# Patient Record
Sex: Female | Born: 1975 | Race: White | Hispanic: No | Smoking: Never smoker
Health system: Southern US, Community
[De-identification: ages and names within clinical notes are randomized; demographics above are authoritative.]

## PROBLEM LIST (undated history)

## (undated) DIAGNOSIS — I10 Essential (primary) hypertension: Secondary | ICD-10-CM

## (undated) DIAGNOSIS — K529 Noninfective gastroenteritis and colitis, unspecified: Secondary | ICD-10-CM

## (undated) HISTORY — PX: THYROID SURGERY: SHX805

## (undated) HISTORY — PX: ABDOMINAL HYSTERECTOMY: SHX81

## (undated) HISTORY — PX: COLON RESECTION: SHX5231

## (undated) HISTORY — PX: KNEE SURGERY: SHX244

## (undated) HISTORY — PX: CHOLECYSTECTOMY: SHX55

---

## 2015-11-06 ENCOUNTER — Encounter (HOSPITAL_BASED_OUTPATIENT_CLINIC_OR_DEPARTMENT_OTHER): Payer: Self-pay

## 2015-11-06 ENCOUNTER — Emergency Department (HOSPITAL_BASED_OUTPATIENT_CLINIC_OR_DEPARTMENT_OTHER): Payer: Medicaid Other

## 2015-11-06 ENCOUNTER — Emergency Department (HOSPITAL_BASED_OUTPATIENT_CLINIC_OR_DEPARTMENT_OTHER)
Admission: EM | Admit: 2015-11-06 | Discharge: 2015-11-06 | Disposition: A | Discharge status: Home / Self Care | Payer: Medicaid Other | Attending: Emergency Medicine | Admitting: Emergency Medicine

## 2015-11-06 DIAGNOSIS — I10 Essential (primary) hypertension: Secondary | ICD-10-CM | POA: Insufficient documentation

## 2015-11-06 DIAGNOSIS — Z79899 Other long term (current) drug therapy: Secondary | ICD-10-CM | POA: Insufficient documentation

## 2015-11-06 DIAGNOSIS — R1031 Right lower quadrant pain: Secondary | ICD-10-CM | POA: Diagnosis present

## 2015-11-06 HISTORY — DX: Essential (primary) hypertension: I10

## 2015-11-06 HISTORY — DX: Noninfective gastroenteritis and colitis, unspecified: K52.9

## 2015-11-06 LAB — COMPREHENSIVE METABOLIC PANEL
ALK PHOS: 84 U/L (ref 38–126)
ALT: 13 U/L — ABNORMAL LOW (ref 14–54)
ANION GAP: 8 (ref 5–15)
AST: 16 U/L (ref 15–41)
Albumin: 4.2 g/dL (ref 3.5–5.0)
BILIRUBIN TOTAL: 0.8 mg/dL (ref 0.3–1.2)
BUN: 9 mg/dL (ref 6–20)
CALCIUM: 9.2 mg/dL (ref 8.9–10.3)
CO2: 25 mmol/L (ref 22–32)
Chloride: 101 mmol/L (ref 101–111)
Creatinine, Ser: 0.74 mg/dL (ref 0.44–1.00)
GFR calc non Af Amer: >60 mL/min (ref >60)
Glucose, Bld: 108 mg/dL — ABNORMAL HIGH (ref 65–99)
Potassium: 3.1 mmol/L — ABNORMAL LOW (ref 3.5–5.1)
SODIUM: 134 mmol/L — AB (ref 135–145)
TOTAL PROTEIN: 7.9 g/dL (ref 6.5–8.1)

## 2015-11-06 LAB — CBC WITH DIFFERENTIAL/PLATELET
Basophils Absolute: 0 10*3/uL (ref 0.0–0.1)
Basophils Relative: 0 %
EOS ABS: 0 10*3/uL (ref 0.0–0.7)
Eosinophils Relative: 0 %
HEMATOCRIT: 41.4 % (ref 36.0–46.0)
HEMOGLOBIN: 14.6 g/dL (ref 12.0–15.0)
LYMPHS ABS: 1.6 10*3/uL (ref 0.7–4.0)
Lymphocytes Relative: 17 %
MCH: 31.1 pg (ref 26.0–34.0)
MCHC: 35.3 g/dL (ref 30.0–36.0)
MCV: 88.3 fL (ref 78.0–100.0)
MONOS PCT: 7 %
Monocytes Absolute: 0.6 10*3/uL (ref 0.1–1.0)
NEUTROS PCT: 76 %
Neutro Abs: 7 10*3/uL (ref 1.7–7.7)
Platelets: 279 10*3/uL (ref 150–400)
RBC: 4.69 MIL/uL (ref 3.87–5.11)
RDW: 13.1 % (ref 11.5–15.5)
WBC: 9.2 10*3/uL (ref 4.0–10.5)

## 2015-11-06 LAB — URINALYSIS, ROUTINE W REFLEX MICROSCOPIC
Bilirubin Urine: NEGATIVE
Glucose, UA: NEGATIVE mg/dL
Ketones, ur: NEGATIVE mg/dL
Leukocytes, UA: NEGATIVE
Nitrite: NEGATIVE
Protein, ur: NEGATIVE mg/dL
Specific Gravity, Urine: 1.014 (ref 1.005–1.030)
pH: 7 (ref 5.0–8.0)

## 2015-11-06 LAB — URINE MICROSCOPIC-ADD ON

## 2015-11-06 MED ORDER — IOPAMIDOL (ISOVUE-300) INJECTION 61%
100.0000 mL | Freq: Once | INTRAVENOUS | Status: AC | PRN
  Administered 2015-11-06: 100 mL via INTRAVENOUS

## 2015-11-06 MED ORDER — ONDANSETRON HCL 4 MG/2ML IJ SOLN
4.0000 mg | Freq: Once | INTRAMUSCULAR | Status: AC
  Administered 2015-11-06: 4 mg via INTRAVENOUS
  Filled 2015-11-06: qty 2

## 2015-11-06 MED ORDER — FENTANYL CITRATE (PF) 100 MCG/2ML IJ SOLN
50.0000 ug | Freq: Once | INTRAMUSCULAR | Status: AC
  Administered 2015-11-06: 50 ug via INTRAVENOUS
  Filled 2015-11-06: qty 2

## 2015-11-06 MED ORDER — SODIUM CHLORIDE 0.9 % IV BOLUS (SEPSIS)
1000.0000 mL | Freq: Once | INTRAVENOUS | Status: AC
  Administered 2015-11-06: 1000 mL via INTRAVENOUS

## 2015-11-06 NOTE — ED Provider Notes (Signed)
[facility] Provider Note   CSN: [id number] Arrival date & time: 11/06/15  1114     History   Chief Complaint Chief Complaint  Patient presents with  . Abdominal Pain    HPI Heidi Patel is a 40 y.o. female.  40 year old female who reports a history of abdominal pain associated with multiple different diagnoses including ulcerative colitis as well as diverticulitis which required a partial colectomy according to her report. This was all done in [place], she has not had any records in our system. She reports that over the last several days she has developed recurrent right lower quadrant pain which is sharp and stabbing, focal, intermittent, severe at worst, mild at best. She has had associated diarrhea with blood streaks, nausea but no vomiting and no fevers or chills.      Past Medical History:  Diagnosis Date  . Colitis   . Hypertension     There are no active problems to display for this patient.   Past Surgical History:  Procedure Laterality Date  . ABDOMINAL HYSTERECTOMY    . CHOLECYSTECTOMY    . COLON RESECTION    . KNEE SURGERY    . THYROID SURGERY      OB History    No data available       Home Medications    Prior to Admission medications   Medication Sig Start Date End Date Taking? Authorizing Provider  dicyclomine (BENTYL) 20 MG tablet Take 40 mg by mouth 4 (four) times daily -  before meals and at bedtime.   Yes Historical Provider, MD  lisinopril-hydrochlorothiazide (PRINZIDE,ZESTORETIC) 20-25 MG tablet Take 1 tablet by mouth daily.   Yes Historical Provider, MD    Family History No family history on file.  Social History Social History  Substance Use Topics  . Smoking status: Never Smoker  . Smokeless tobacco: Never Used  . Alcohol use No     Allergies   Review of patient's allergies indicates no known allergies.   Review of Systems Review of Systems  All other systems reviewed and are  negative.    Physical Exam Updated Vital Signs BP (!) 140/105 (BP Location: Right Arm)   Pulse 92   Temp 97.9 F (36.6 C) (Oral)   Resp 20   Ht 5\' 3"  (1.6 m)   Wt 229 lb (103.9 kg)   SpO2 100%   BMI 40.57 kg/m   Physical Exam  Constitutional: She appears well-developed and well-nourished. No distress.  HENT:  Head: Normocephalic and atraumatic.  Mouth/Throat: Oropharynx is clear and moist. No oropharyngeal exudate.  Eyes: Conjunctivae and EOM are normal. Pupils are equal, round, and reactive to light. Right eye exhibits no discharge. Left eye exhibits no discharge. No scleral icterus.  Neck: Normal range of motion. Neck supple. No JVD present. No thyromegaly present.  Cardiovascular: Regular rhythm, normal heart sounds and intact distal pulses.  Exam reveals no gallop and no friction rub.   No murmur heard. Mild tachy  Pulmonary/Chest: Effort normal and breath sounds normal. No respiratory distress. She has no wheezes. She has no rales.  Abdominal: Soft. Bowel sounds are normal. She exhibits no distension and no mass. There is no tenderness ( RLQ ttp, no guarding, masses or peritoneal signs).  Musculoskeletal: Normal range of motion. She exhibits no edema or tenderness.  Lymphadenopathy:    She has no cervical adenopathy.  Neurological: She is alert. Coordination normal.  Skin: Skin is warm and dry. No rash noted. No erythema.  Psychiatric: She has a normal mood and affect. Her behavior is normal.  Nursing note and vitals reviewed.    ED Treatments / Results  Labs (all labs ordered are listed, but only abnormal results are displayed) Labs Reviewed  URINALYSIS, ROUTINE W REFLEX MICROSCOPIC (NOT AT [facility]) - Abnormal; Notable for the following:       Result Value   APPearance CLOUDY (*)    Hgb urine dipstick SMALL (*)    All other components within normal limits  URINE MICROSCOPIC-ADD ON - Abnormal; Notable for the following:    Squamous Epithelial / LPF 0-5 (*)     Bacteria, UA MANY (*)    All other components within normal limits  COMPREHENSIVE METABOLIC PANEL - Abnormal; Notable for the following:    Sodium 134 (*)    Potassium 3.1 (*)    Glucose, Bld 108 (*)    ALT 13 (*)    All other components within normal limits  CBC WITH DIFFERENTIAL/PLATELET     Radiology Ct Abdomen Pelvis W Contrast  Result Date: 11/06/2015 CLINICAL DATA:  RIGHT-side abdominal pain with blood in stool for 1 day, past history of diverticulitis with colon surgery and abscess last year EXAM: CT ABDOMEN AND PELVIS WITH CONTRAST TECHNIQUE: Multidetector CT imaging of the abdomen and pelvis was performed using the standard protocol following bolus administration of intravenous contrast. Sagittal and coronal MPR images reconstructed from axial data set. CONTRAST:  ISOVUE-300 IOPAMIDOL (ISOVUE-300) INJECTION 61% IV. Dilute oral contrast. COMPARISON:  None FINDINGS: Lower chest:  Lung bases clear Hepatobiliary: Gallbladder and liver normal appearance Pancreas: Normal appearance Spleen: Normal appearance.  Probable tiny splenules. Adrenals/Urinary Tract: Adrenal glands normal appearance. Kidneys, ureters, and bladder normal appearance. Stomach/Bowel: Normal appendix. Colon incompletely distended limiting assessment of wall thickness. Few scattered colonic diverticula. Prior sigmoid resection with anastomosis. Stomach and small bowel loops unremarkable. Vascular/Lymphatic: Aorta normal caliber.  No adenopathy. Reproductive: Uterus surgically absent. Probable residual cervix. Normal ovaries. Other: No free air or free fluid. Postsurgical scar at the anterior abdominal wall of the pelvis. No hernia. Musculoskeletal: Degenerative disc disease changes at L5-S1 with BILATERAL neural foraminal narrowing of L5-S1. IMPRESSION: No acute intra-abdominal or intrapelvic abnormalities. Minimal scattered colonic diverticulosis and evidence of prior sigmoid resection without evidence of acute  diverticulitis. Electronically Signed   By: Ulyses Southward M.D.   On: 11/06/2015 14:08    Procedures Procedures (including critical care time)  Medications Ordered in ED Medications  sodium chloride 0.9 % bolus 1,000 mL (0 mLs Intravenous Stopped 11/06/15 1310)  fentaNYL (SUBLIMAZE) injection 50 mcg (50 mcg Intravenous Given 11/06/15 1233)  ondansetron (ZOFRAN) injection 4 mg (4 mg Intravenous Given 11/06/15 1228)  iopamidol (ISOVUE-300) 61 % injection 100 mL (100 mLs Intravenous Contrast Given 11/06/15 1324)     Initial Impression / Assessment and Plan / ED Course  I have reviewed the triage vital signs and the nursing notes.  Pertinent labs & imaging results that were available during my care of the patient were reviewed by me and considered in my medical decision making (see chart for details).  Clinical Course   I would consider the patient to have a flareup of ulcerative colitis, this could be related to diverticulitis, perforation or recurrent abscess. Unsure of her status regarding appendicitis given prior partial colectomy. CT scan ordered, fluids, pain medications.  Reviewed labs and CT scan as well as the drug database, the patient has had multiple prescriptions filled for narcotic medications since March, this includes 23  different prescriptions. The patient will not be given any prescription medications at this visit. Her CT scan does not show any surgical findings. Vital signs reviewed, patient informed of the results  Final Clinical Impressions(s) / ED Diagnoses   Final diagnoses:  Right lower quadrant abdominal pain    New Prescriptions New Prescriptions   No medications on file     Eber HongBrian Margareta Laureano, MD 11/06/15 1414

## 2015-11-06 NOTE — Discharge Instructions (Signed)

## 2015-11-06 NOTE — ED Triage Notes (Signed)
C/o abd pain n/d started yesterday-c/o bloody stools since [place]

## 2017-10-15 IMAGING — CT CT ABD-PELV W/ CM
2 of 4 series · 16 of 46 positions shown, 18 images · IV contrast (APPLIED)
Comparison: None

CLINICAL DATA: RIGHT-side abdominal pain with blood in stool for 1
day, past history of diverticulitis with colon surgery and abscess
last year

EXAM:
CT ABDOMEN AND PELVIS WITH CONTRAST
TECHNIQUE: Multidetector CT imaging of the abdomen and pelvis was performed
using the standard protocol following bolus administration of
intravenous contrast. Sagittal and coronal MPR images reconstructed
from axial data set.
CONTRAST:  100mL V3V4CT-SCC IOPAMIDOL (V3V4CT-SCC) INJECTION 61% IV.
Dilute oral contrast.

[Series 2: axial st · axial · 0.98mm/px · z∈[-473,+7]mm · 13 of 104 slices shown, 15 images]
[im 4/104  soft-tissue]
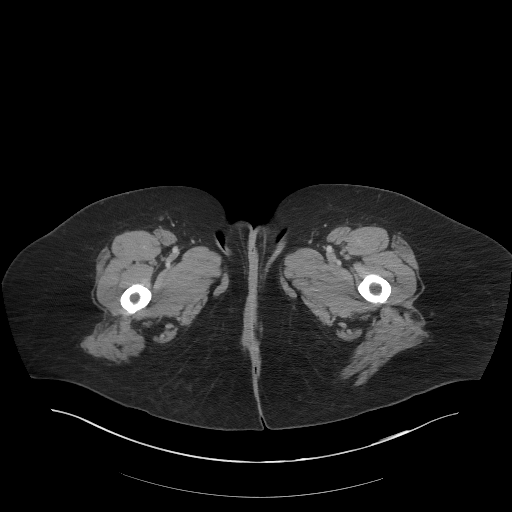
[im 4/104  bone]
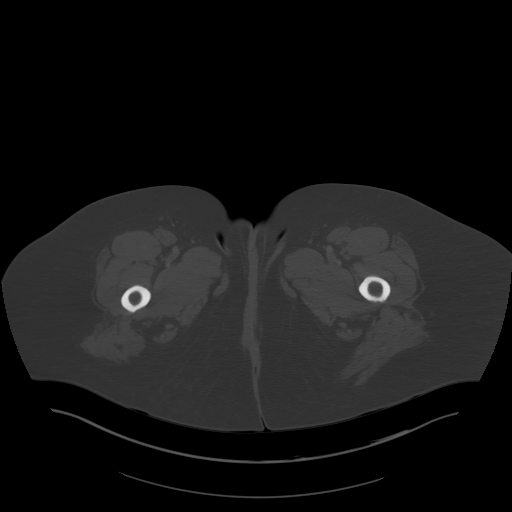
[im 12/104  soft-tissue]
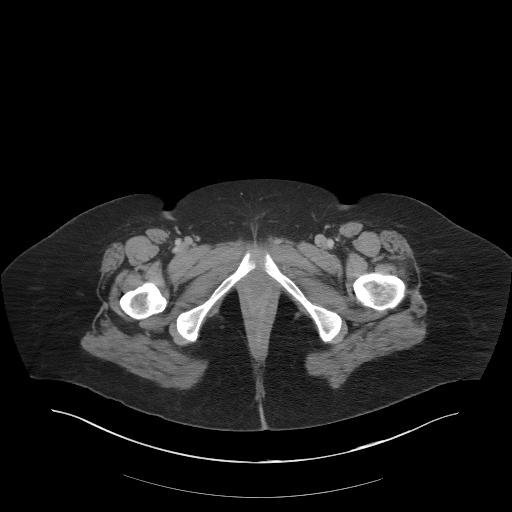
[im 20/104  soft-tissue]
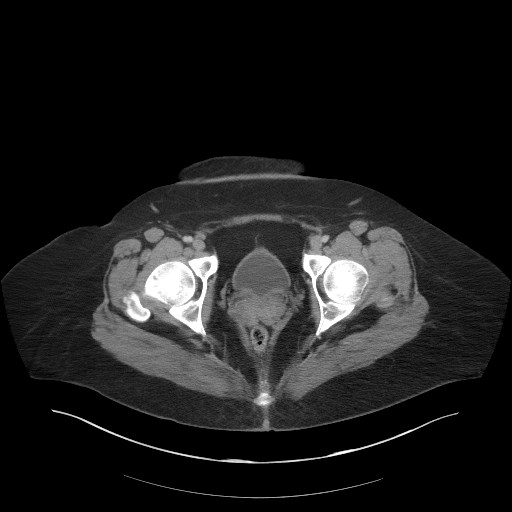
[im 28/104  soft-tissue]
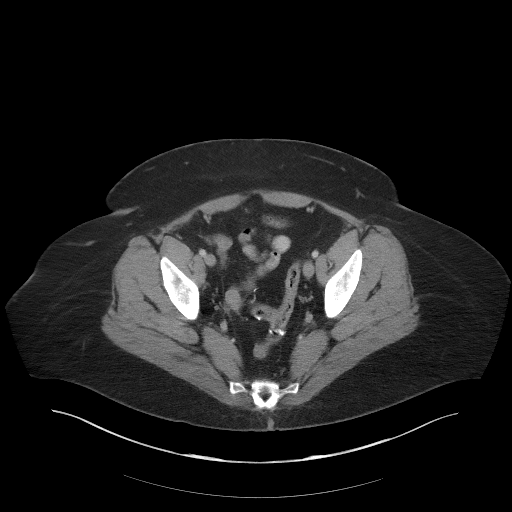
[im 36/104  soft-tissue]
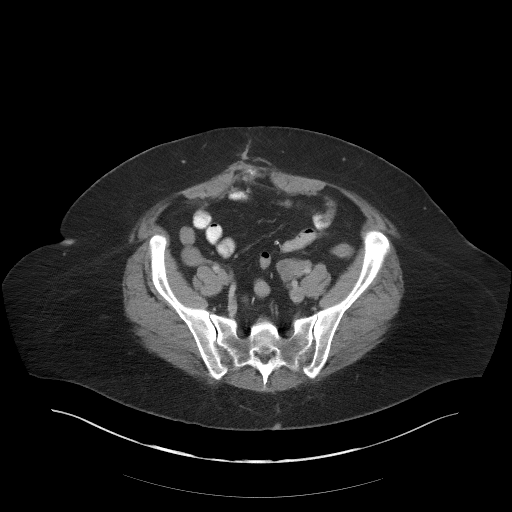
[im 44/104  soft-tissue]
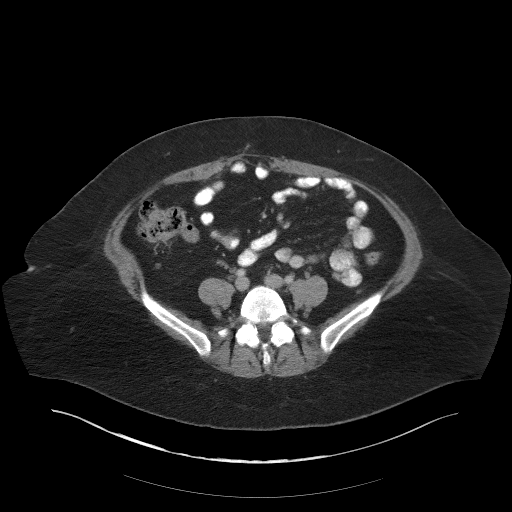
[im 52/104  soft-tissue]
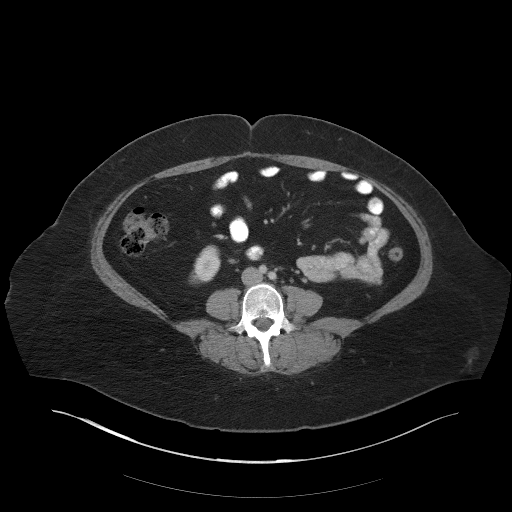
[im 60/104  soft-tissue]
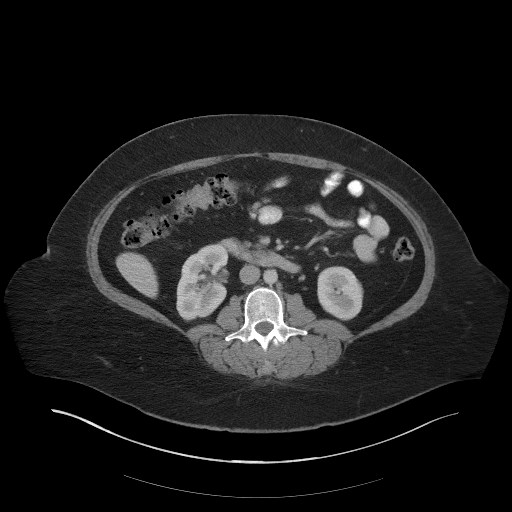
[im 68/104  soft-tissue]
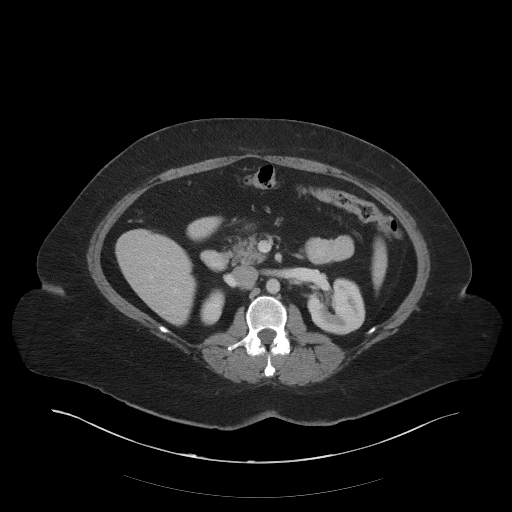
[im 68/104  bone]
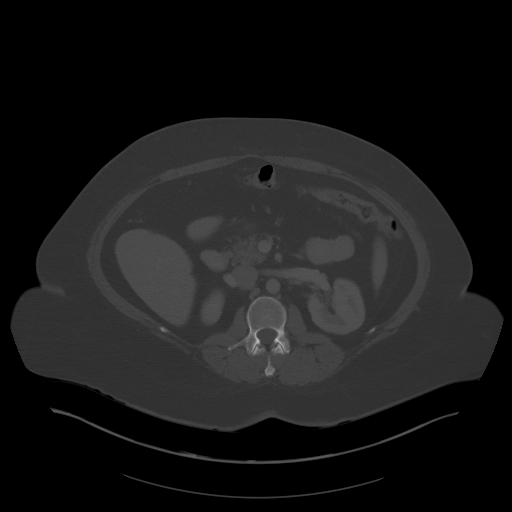
[im 76/104  soft-tissue]
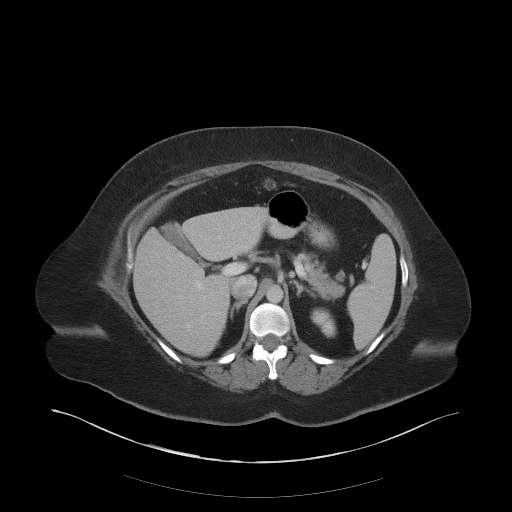
[im 84/104  soft-tissue]
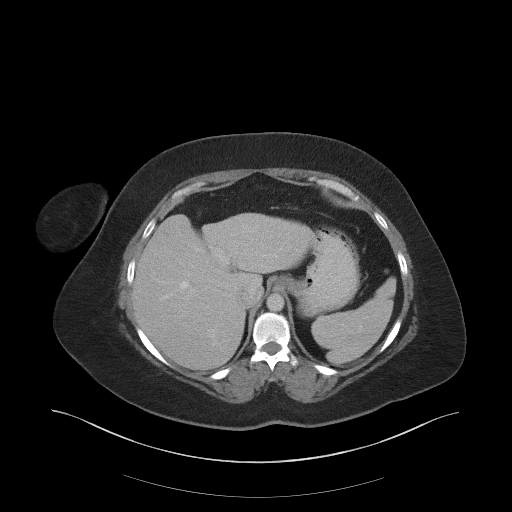
[im 92/104  soft-tissue]
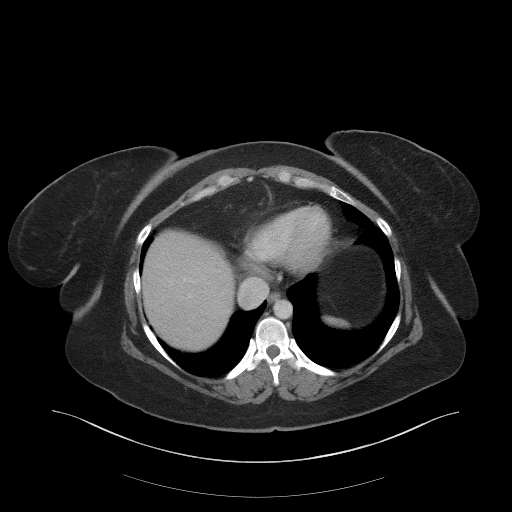
[im 100/104  soft-tissue]
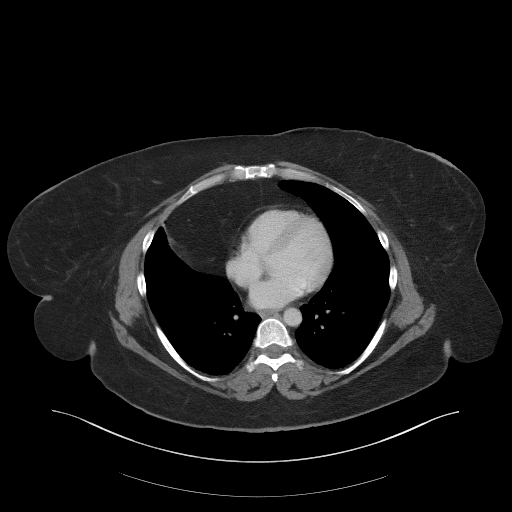

[Series 5: coronal st · coronal · 1.06mm/px · 3 of 91 slices shown]
[im 31/91  soft-tissue]
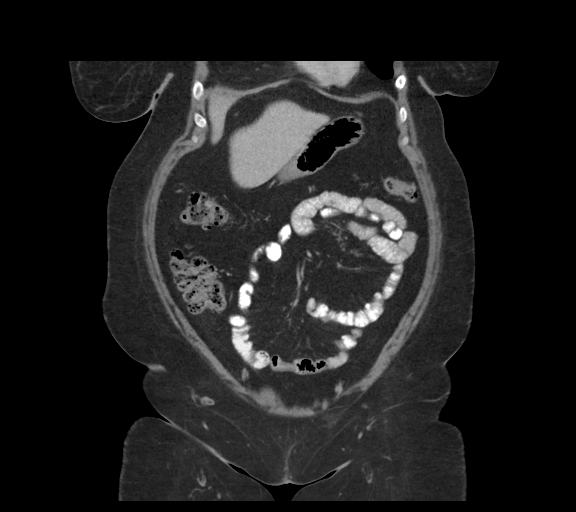
[im 41/91  soft-tissue]
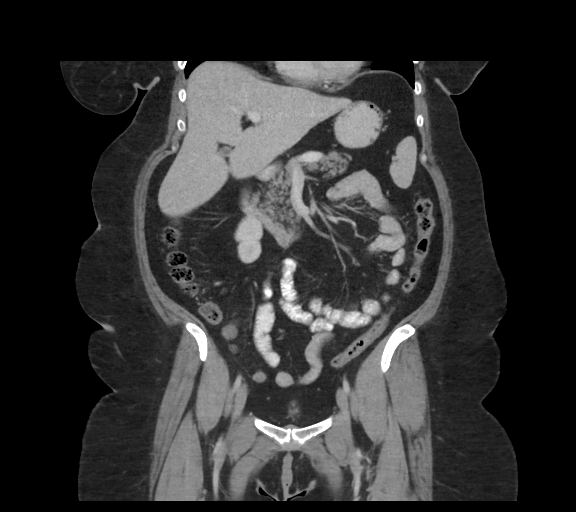
[im 51/91  soft-tissue]
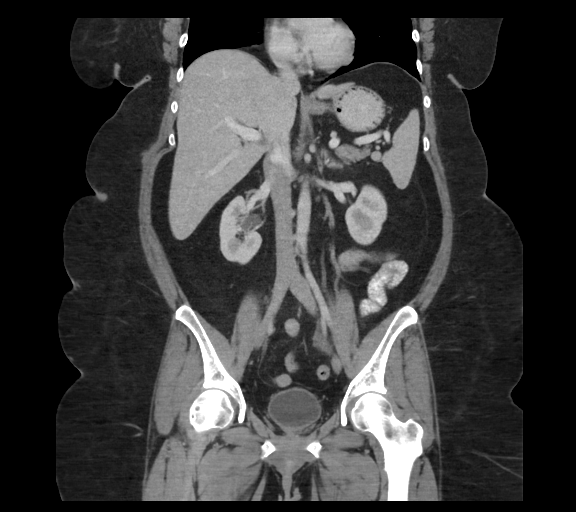

[16 of 46 positions shown; findings below may reference images not displayed]

FINDINGS: Lower chest:  Lung bases clear

Hepatobiliary: Gallbladder and liver normal appearance

Pancreas: Normal appearance

Spleen: Normal appearance.  Probable tiny splenules.

Adrenals/Urinary Tract: Adrenal glands normal appearance. Kidneys,
ureters, and bladder normal appearance.

Stomach/Bowel: Normal appendix. Colon incompletely distended
limiting assessment of wall thickness. Few scattered colonic
diverticula. Prior sigmoid resection with anastomosis. Stomach and
small bowel loops unremarkable.

Vascular/Lymphatic: Aorta normal caliber.  No adenopathy.

Reproductive: Uterus surgically absent. Probable residual cervix.
Normal ovaries.

Other: No free air or free fluid. Postsurgical scar at the anterior
abdominal wall of the pelvis. No hernia.

Musculoskeletal: Degenerative disc disease changes at L5-S1 with
BILATERAL neural foraminal narrowing of L5-S1.
IMPRESSION: No acute intra-abdominal or intrapelvic abnormalities.

Minimal scattered colonic diverticulosis and evidence of prior
sigmoid resection without evidence of acute diverticulitis.
